# Patient Record
Sex: Female | Born: 1955 | Race: Black or African American | Hispanic: No | Marital: Single | Smoking: Former smoker
Health system: Southern US, Community
[De-identification: ages and names within clinical notes are randomized; demographics above are authoritative.]

## PROBLEM LIST (undated history)

## (undated) DIAGNOSIS — I10 Essential (primary) hypertension: Secondary | ICD-10-CM

## (undated) DIAGNOSIS — G510 Bell's palsy: Secondary | ICD-10-CM

## (undated) HISTORY — PX: ABDOMINAL SURGERY: SHX537

---

## 2009-05-29 ENCOUNTER — Ambulatory Visit: Payer: Self-pay | Admitting: Diagnostic Radiology

## 2009-05-29 ENCOUNTER — Emergency Department (HOSPITAL_BASED_OUTPATIENT_CLINIC_OR_DEPARTMENT_OTHER): Admission: EM | Admit: 2009-05-29 | Discharge: 2009-05-29 | Payer: Self-pay | Admitting: Emergency Medicine

## 2013-01-05 ENCOUNTER — Encounter (HOSPITAL_BASED_OUTPATIENT_CLINIC_OR_DEPARTMENT_OTHER): Payer: Self-pay | Admitting: *Deleted

## 2013-01-05 ENCOUNTER — Emergency Department (HOSPITAL_BASED_OUTPATIENT_CLINIC_OR_DEPARTMENT_OTHER)
Admission: EM | Admit: 2013-01-05 | Discharge: 2013-01-05 | Disposition: A | Discharge status: Home / Self Care | Payer: Self-pay | Attending: Emergency Medicine | Admitting: Emergency Medicine

## 2013-01-05 DIAGNOSIS — Z8669 Personal history of other diseases of the nervous system and sense organs: Secondary | ICD-10-CM | POA: Insufficient documentation

## 2013-01-05 DIAGNOSIS — Z79899 Other long term (current) drug therapy: Secondary | ICD-10-CM | POA: Insufficient documentation

## 2013-01-05 DIAGNOSIS — L0291 Cutaneous abscess, unspecified: Secondary | ICD-10-CM

## 2013-01-05 DIAGNOSIS — IMO0002 Reserved for concepts with insufficient information to code with codable children: Secondary | ICD-10-CM | POA: Insufficient documentation

## 2013-01-05 HISTORY — DX: Bell's palsy: G51.0

## 2013-01-05 MED ORDER — CEPHALEXIN 500 MG PO CAPS: 500.0000 mg | ORAL_CAPSULE | Freq: Four times a day (QID) | ORAL | Status: DC

## 2013-01-05 MED ORDER — HYDROCODONE-ACETAMINOPHEN 5-325 MG PO TABS: 2.0000 | ORAL_TABLET | ORAL | Status: DC | PRN

## 2013-01-05 MED ORDER — SULFAMETHOXAZOLE-TRIMETHOPRIM 800-160 MG PO TABS: 1.0000 | ORAL_TABLET | Freq: Two times a day (BID) | ORAL | Status: DC

## 2013-01-05 NOTE — ED Notes (Signed)
Pt. Has redness and edema noted under the R arm.  Pt. Has no drainage noted.

## 2013-01-05 NOTE — Discharge Instructions (Signed)
Abscess An abscess is an infected area that contains a collection of pus and debris.It can occur in almost any part of the body. An abscess is also known as a furuncle or boil. CAUSES  An abscess occurs when tissue gets infected. This can occur from blockage of oil or sweat glands, infection of hair follicles, or a minor injury to the skin. As the body tries to fight the infection, pus collects in the area and creates pressure under the skin. This pressure causes pain. People with weakened immune systems have difficulty fighting infections and get certain abscesses more often.  SYMPTOMS Usually an abscess develops on the skin and becomes a painful mass that is red, warm, and tender. If the abscess forms under the skin, you may feel a moveable soft area under the skin. Some abscesses break open (rupture) on their own, but most will continue to get worse without care. The infection can spread deeper into the body and eventually into the bloodstream, causing you to feel ill.  DIAGNOSIS  Your caregiver will take your medical history and perform a physical exam. A sample of fluid may also be taken from the abscess to determine what is causing your infection. TREATMENT  Your caregiver may prescribe antibiotic medicines to fight the infection. However, taking antibiotics alone usually does not cure an abscess. Your caregiver may need to make a small cut (incision) in the abscess to drain the pus. In some cases, gauze is packed into the abscess to reduce pain and to continue draining the area. HOME CARE INSTRUCTIONS   Only take over-the-counter or prescription medicines for pain, discomfort, or fever as directed by your caregiver.  If you were prescribed antibiotics, take them as directed. Finish them even if you start to feel better.  If gauze is used, follow your caregiver's directions for changing the gauze.  To avoid spreading the infection:  Keep your draining abscess covered with a  bandage.  Wash your hands well.  Do not share personal care items, towels, or whirlpools with others.  Avoid skin contact with others.  Keep your skin and clothes clean around the abscess.  Keep all follow-up appointments as directed by your caregiver. SEEK MEDICAL CARE IF:   You have increased pain, swelling, redness, fluid drainage, or bleeding.  You have muscle aches, chills, or a general ill feeling.  You have a fever. MAKE SURE YOU:   Understand these instructions.  Will watch your condition.  Will get help right away if you are not doing well or get worse. Document Released: 02/04/2005 Document Revised: 10/27/2011 Document Reviewed: 07/10/2011 [employer] Patient Information 2014 [employer].  

## 2013-01-05 NOTE — ED Provider Notes (Signed)
Medical screening examination/treatment/procedure(s) were performed by non-physician practitioner and as supervising physician I was immediately available for consultation/collaboration.   Shanna Cisco, MD 01/05/13 2030

## 2013-01-05 NOTE — ED Provider Notes (Signed)
CSN: [id number]     Arrival date & time 01/05/13  1243 History   First MD Initiated Contact with Patient 01/05/13 1349     Chief Complaint  Patient presents with  . Abscess   (Consider location/radiation/quality/duration/timing/severity/associated sxs/prior Treatment) HPI Comments: Pt states that she started with swelling and redness under her right arm 5 days ago:pt states that she has no history of similar symptoms:pt states that area seem to be increasing in size:denies drainage  The history is provided by the patient. No language interpreter was used.    Past Medical History  Diagnosis Date  . Bell's palsy     54yrs ago   No past surgical history on file. No family history on file. History  Substance Use Topics  . Smoking status: Not on file  . Smokeless tobacco: Not on file  . Alcohol Use: Not on file   OB History   Grav Para Term Preterm Abortions TAB SAB Ect Mult Living                 Review of Systems  Constitutional: Negative.   Respiratory: Negative.   Cardiovascular: Negative.     Allergies  Review of patient's allergies indicates no known allergies.  Home Medications   Current Outpatient Rx  Name  Route  Sig  Dispense  Refill  . cephALEXin (KEFLEX) 500 MG capsule   Oral   Take 1 capsule (500 mg total) by mouth 4 (four) times daily.   28 capsule   0   . HYDROcodone-acetaminophen (NORCO/VICODIN) 5-325 MG per tablet   Oral   Take 2 tablets by mouth every 4 (four) hours as needed for pain.   10 tablet   0   . sulfamethoxazole-trimethoprim (SEPTRA DS) 800-160 MG per tablet   Oral   Take 1 tablet by mouth 2 (two) times daily.   14 tablet   0    BP 160/100  Pulse 80  Temp(Src) 98.6 F (37 C) (Oral)  Resp 20  Ht 4\' 11"  (1.499 m)  Wt 101 lb (45.813 kg)  BMI 20.39 kg/m2  SpO2 100% Physical Exam  Nursing note and vitals reviewed. Constitutional: She is oriented to person, place, and time. She appears well-developed and well-nourished.   HENT:  Head: Normocephalic and atraumatic.  Eyes: Conjunctivae and EOM are normal.  Neck: Neck supple.  Cardiovascular: Normal rate and regular rhythm.   Pulmonary/Chest: Effort normal and breath sounds normal.  Musculoskeletal: Normal range of motion.  Neurological: She is alert and oriented to person, place, and time.  Skin:  Small red raised swollen fluctuant area to the under right arm:no drainage noted  Psychiatric: She has a normal mood and affect.    ED Course  INCISION AND DRAINAGE Date/Time: 01/05/2013 3:32 PM Performed by: Teressa Lower Authorized by: Teressa Lower Consent given by: patient Patient identity confirmed: verbally with patient Time out: Immediately prior to procedure a "time out" was called to verify the correct patient, procedure, equipment, support staff and site/side marked as required. Type: abscess Anesthesia: local infiltration Local anesthetic: lidocaine 2% with epinephrine Scalpel size: 11 Incision type: single straight Drainage: purulent Drainage amount: scant Patient tolerance: Patient tolerated the procedure well with no immediate complications.   (including critical care time) Labs Review Labs Reviewed - No data to display Imaging Review No results found.  MDM   1. Abscess    Tetanus is WUJ:WJXB have pt follow up as needed  Teressa Lower, NP 01/05/13 1532

## 2017-03-22 ENCOUNTER — Encounter (HOSPITAL_BASED_OUTPATIENT_CLINIC_OR_DEPARTMENT_OTHER): Payer: Self-pay

## 2017-03-22 ENCOUNTER — Other Ambulatory Visit: Payer: Self-pay

## 2017-03-22 ENCOUNTER — Emergency Department (HOSPITAL_BASED_OUTPATIENT_CLINIC_OR_DEPARTMENT_OTHER)
Admission: EM | Admit: 2017-03-22 | Discharge: 2017-03-22 | Disposition: A | Discharge status: Home / Self Care | Payer: Self-pay | Attending: Emergency Medicine | Admitting: Emergency Medicine

## 2017-03-22 ENCOUNTER — Emergency Department (HOSPITAL_BASED_OUTPATIENT_CLINIC_OR_DEPARTMENT_OTHER): Payer: Self-pay

## 2017-03-22 DIAGNOSIS — I10 Essential (primary) hypertension: Secondary | ICD-10-CM | POA: Insufficient documentation

## 2017-03-22 DIAGNOSIS — M79602 Pain in left arm: Secondary | ICD-10-CM | POA: Insufficient documentation

## 2017-03-22 DIAGNOSIS — Z87891 Personal history of nicotine dependence: Secondary | ICD-10-CM | POA: Insufficient documentation

## 2017-03-22 HISTORY — DX: Essential (primary) hypertension: I10

## 2017-03-22 LAB — COMPREHENSIVE METABOLIC PANEL
ALBUMIN: 4.3 g/dL (ref 3.5–5.0)
ALT: 12 U/L — AB (ref 14–54)
AST: 23 U/L (ref 15–41)
Alkaline Phosphatase: 56 U/L (ref 38–126)
Anion gap: 8 (ref 5–15)
BUN: 10 mg/dL (ref 6–20)
CHLORIDE: 106 mmol/L (ref 101–111)
CO2: 25 mmol/L (ref 22–32)
CREATININE: 0.73 mg/dL (ref 0.44–1.00)
Calcium: 9.9 mg/dL (ref 8.9–10.3)
GFR calc Af Amer: >60 mL/min (ref >60)
GFR calc non Af Amer: >60 mL/min (ref >60)
Glucose, Bld: 91 mg/dL (ref 65–99)
Potassium: 3.8 mmol/L (ref 3.5–5.1)
SODIUM: 139 mmol/L (ref 135–145)
Total Bilirubin: 0.7 mg/dL (ref 0.3–1.2)
Total Protein: 8 g/dL (ref 6.5–8.1)

## 2017-03-22 LAB — CBC
HCT: 35.9 % — ABNORMAL LOW (ref 36.0–46.0)
Hemoglobin: 11.8 g/dL — ABNORMAL LOW (ref 12.0–15.0)
MCH: 27.8 pg (ref 26.0–34.0)
MCHC: 32.9 g/dL (ref 30.0–36.0)
MCV: 84.7 fL (ref 78.0–100.0)
Platelets: 183 10*3/uL (ref 150–400)
RBC: 4.24 MIL/uL (ref 3.87–5.11)
RDW: 12.3 % (ref 11.5–15.5)
WBC: 2.9 10*3/uL — ABNORMAL LOW (ref 4.0–10.5)

## 2017-03-22 LAB — TROPONIN I: Troponin I: <0.03 ng/mL (ref <0.03)

## 2017-03-22 MED ORDER — ACETAMINOPHEN 325 MG PO TABS
650.0000 mg | ORAL_TABLET | Freq: Once | ORAL | Status: AC
  Administered 2017-03-22: 650 mg via ORAL
  Filled 2017-03-22: qty 2

## 2017-03-22 NOTE — Discharge Instructions (Addendum)
Please follow up with your PCP.   Please take tylenol every 6-8 hours as needed for your pain.    If your symptoms worsen please seek additional medical care.   Today your white blood cell count was low.  You need to follow-up with your doctor regarding this. While in the ED your blood pressure was high.  Please follow up with your primary care doctor or the wellness clinic for repeat evaluation as you may need medication.  High blood pressure can cause long term, potentially serious, damage if left untreated.

## 2017-03-22 NOTE — ED Provider Notes (Signed)
[facility] EMERGENCY DEPARTMENT Provider Note   CSN: [id number] Arrival date & time: 03/22/17  1214     History   Chief Complaint Chief Complaint  Patient presents with  . Arm Pain    HPI Brandy Peters is a 61 y.o. female who presents today for evaluation of left arm pain for approximately 1 week.  She says that this feels like her arthritis pain however it has never been in her arm before.  She says that her pain is worse with movement and is better with keeping it still.  She says that she has been keeping her left arm still for the past couple days.  She reports that she hit her forearm on the edge of a door approximately 1 week ago.  She does not currently have any pain here.  She has been taking 1 pill of Tylenol once a day for her pain with mild relief.  No fevers or chills.  She reports that she has a history of Bell's palsy and that she has continued droop on the left side of her face.  She says that this is unchanged and is at her normal baseline.  She also reports that her lower lip swelled its normal baseline.  She denies any new/abnormal facial swelling, facial droop, lip swelling.  Her family reports that they feel like her swelling, speech, and facial droop are at her baseline.  HPI  Past Medical History:  Diagnosis Date  . Bell's palsy    56yrs ago  . Hypertension     There are no active problems to display for this patient.   Past Surgical History:  Procedure Laterality Date  . ABDOMINAL SURGERY      OB History    No data available       Home Medications    Prior to Admission medications   Not on File    Family History No family history on file.  Social History Social History   Tobacco Use  . Smoking status: Former Games developer  . Smokeless tobacco: Never Used  Substance Use Topics  . Alcohol use: No    Frequency: Never  . Drug use: No     Allergies   Patient has no known allergies.   Review of Systems Review of Systems    Constitutional: Negative for chills, fatigue and fever.  Respiratory: Negative for chest tightness and shortness of breath.   Cardiovascular: Negative for chest pain, palpitations and leg swelling.  Musculoskeletal: Positive for arthralgias. Negative for neck pain and neck stiffness.       Left arm pain.   Skin: Negative for color change and wound.  Neurological: Positive for facial asymmetry and speech difficulty. Negative for numbness and headaches.     Physical Exam Updated Vital Signs BP (!) 159/94   Pulse 80   Temp 98.4 F (36.9 C) (Oral)   Resp 18   Ht 4\' 11"  (1.499 m)   Wt 44 kg (97 lb)   SpO2 100%   BMI 19.59 kg/m   Physical Exam  Constitutional: She is oriented to person, place, and time. She appears well-developed and well-nourished.  HENT:  Head: Normocephalic and atraumatic.  Mouth/Throat: No oropharyngeal exudate.  Eyes: Conjunctivae are normal.  Neck: Normal range of motion. Neck supple.  No neck tenderness.  Cardiovascular: Normal rate, regular rhythm, normal heart sounds and intact distal pulses.  No murmur heard. 2+ radial pulses bilaterally.   Pulmonary/Chest: Effort normal and breath sounds normal. No respiratory distress.  She has no wheezes. She exhibits no tenderness.  Musculoskeletal:  TTP over posterior left arm consistent with triceps.  The tenderness does not extend beyond the elbow.  There is no abnormal swelling, erythema, or ecchymosis.  No crepitus.  No TTP over left shoulder, or forearm.   Neurological: She is alert and oriented to person, place, and time. No sensory deficit. She exhibits normal muscle tone.  5/5 strength in bilateral upper extremities.  Obvious left sided facial droop.  Lower lip is enlarged.  Patient's speech is slightly slurred but understandable.  Skin: She is not diaphoretic.  Nursing note and vitals reviewed.    ED Treatments / Results  Labs (all labs ordered are listed, but only abnormal results are  displayed) Labs Reviewed  COMPREHENSIVE METABOLIC PANEL - Abnormal; Notable for the following components:      Result Value   ALT 12 (*)    All other components within normal limits  CBC - Abnormal; Notable for the following components:   WBC 2.9 (*)    Hemoglobin 11.8 (*)    HCT 35.9 (*)    All other components within normal limits  TROPONIN I    EKG  EKG Interpretation  Date/Time:  Monday March 22 2017 14:48:20 EST Ventricular Rate:  70 PR Interval:    QRS Duration: 86 QT Interval:  416 QTC Calculation: 449 R Axis:   -44 Text Interpretation:  Sinus rhythm No prior ECG for comparison.  No STEMI Left axis deviation Confirmed by Theda Belfastegeler, Chris ([id number]) on 03/22/2017 3:04:47 PM       Radiology Dg Chest 2 View  Result Date: 03/22/2017 CLINICAL DATA:  Chest and left upper extremity pain EXAM: CHEST  2 VIEW COMPARISON:  None. FINDINGS: There is no edema or consolidation. Heart size and pulmonary vascularity are normal. No adenopathy. There is aortic atherosclerosis. No pneumothorax. No bone lesions appreciable. IMPRESSION: Aortic atherosclerosis.  No edema or consolidation. Aortic Atherosclerosis (ICD10-I70.0). Electronically Signed   By: Bretta BangWilliam  Woodruff III M.D.   On: 03/22/2017 14:48   Dg Humerus Left  Result Date: 03/22/2017 CLINICAL DATA:  Pain over the last week.  Injury 6 days ago. EXAM: LEFT HUMERUS - 2+ VIEW COMPARISON:  None. FINDINGS: There is no evidence of fracture or other focal bone lesions. Soft tissues are unremarkable. IMPRESSION: Negative. Electronically Signed   By: Paulina FusiMark  Shogry M.D.   On: 03/22/2017 14:48    Procedures Procedures (including critical care time)  Medications Ordered in ED Medications  acetaminophen (TYLENOL) tablet 650 mg (650 mg Oral Given 03/22/17 1453)     Initial Impression / Assessment and Plan / ED Course  I have reviewed the triage vital signs and the nursing notes.  Pertinent labs & imaging results that were available  during my care of the patient were reviewed by me and considered in my medical decision making (see chart for details).    Ancil BoozerQueen Hamstra patient's today for evaluation of left arm pain that has been present for approximately 1 week.  She does report that she hit that arm, however is not painful over the area where she hit her arm.  X-rays were obtained which did not show any acute abnormalities.  CXR was obtained, no widened mediastinum, pneumonia, or other concerning acute lesions.  She was noted to be hypertensive in the ED.  She is not taking any medicines for her HTN as she does not have a PCP or insurance.  Basic labs were obtained, along with EKG and troponin  due to age, HTN, and vague left arm pain.  WBC low, but labs and EKG otherwise reassuring.  I instructed patient to obtain a PCP.  While in the ED her BP dropped to 159/94 once her family arrived and she was able to relax.    Instructed to take tylenol for her pain and in additional conservative measures.   Final Clinical Impressions(s) / ED Diagnoses   Final diagnoses:  Left arm pain  Hypertension, unspecified type    ED Discharge Orders    None       Norman ClayHammond, Decklin Weddington W, PA-C 03/22/17 2001    Tegeler, Canary Brimhristopher J, MD 03/22/17 2010

## 2017-03-22 NOTE — ED Triage Notes (Signed)
C/o pain to left arm x 1 week-states feels like arthritis pain-worse with movement-states she also injured left arm hitting it on the door at home 6 days ago-NAD-steady gait

## 2018-03-29 IMAGING — CR DG HUMERUS 2V *L*
3 series · 3 of 3 positions shown · non-contrast
Comparison: None.

CLINICAL DATA: Pain over the last week.  Injury 6 days ago.

EXAM:
LEFT HUMERUS - 2+ VIEW

[w humerus lat left *]
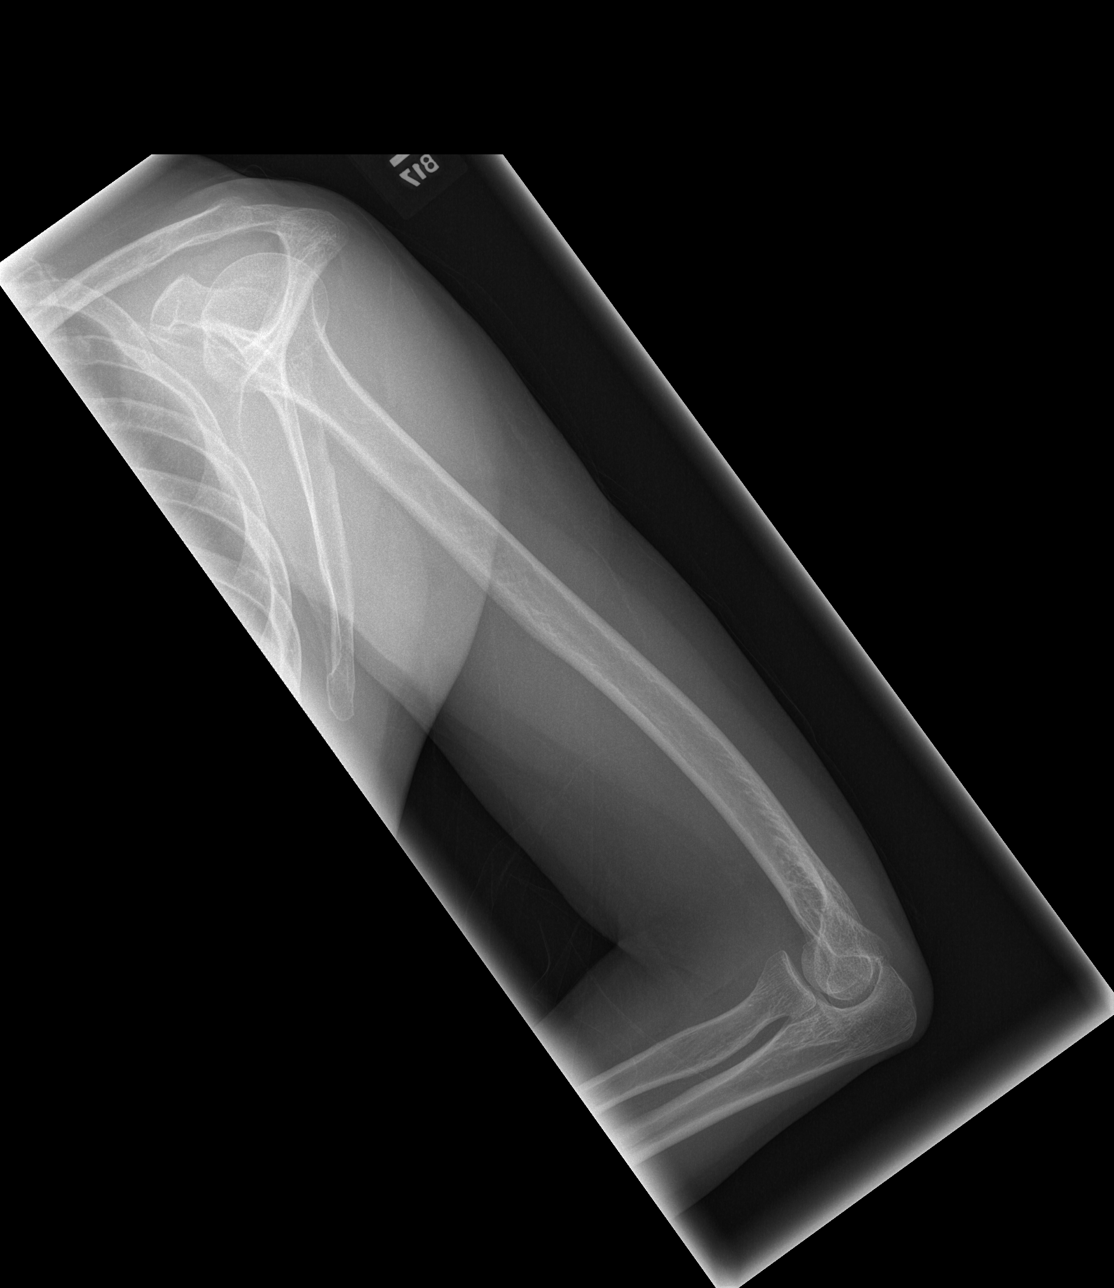

[w humerus ap left * (1 of 2)]
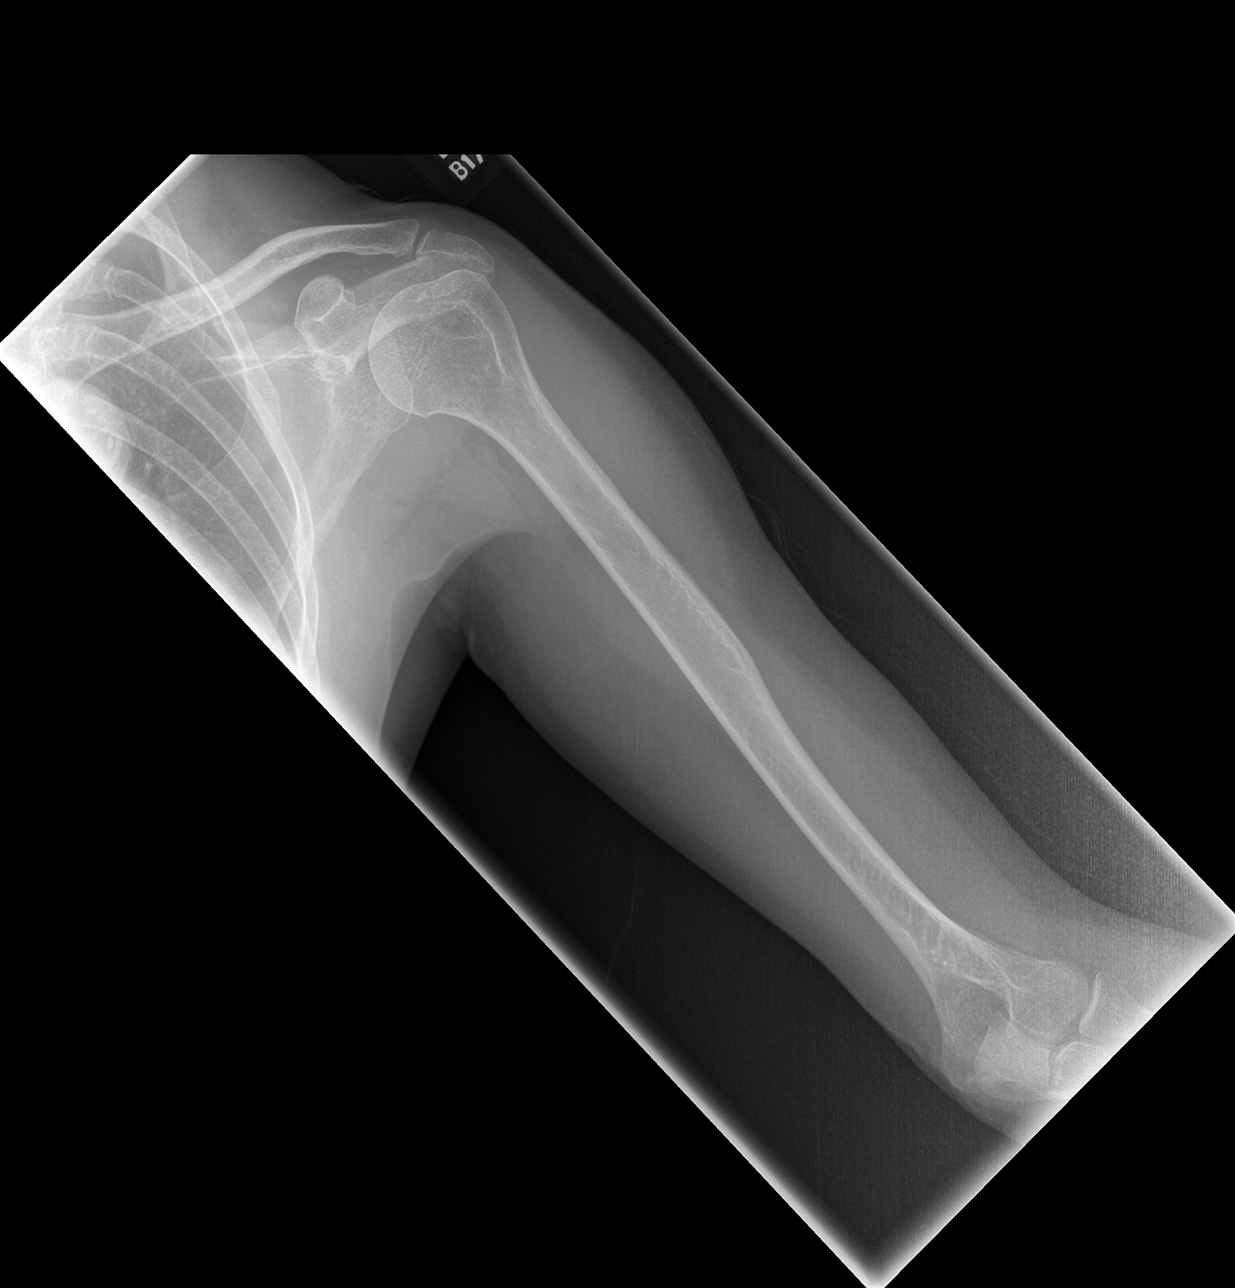

[w humerus ap left * (2 of 2)]
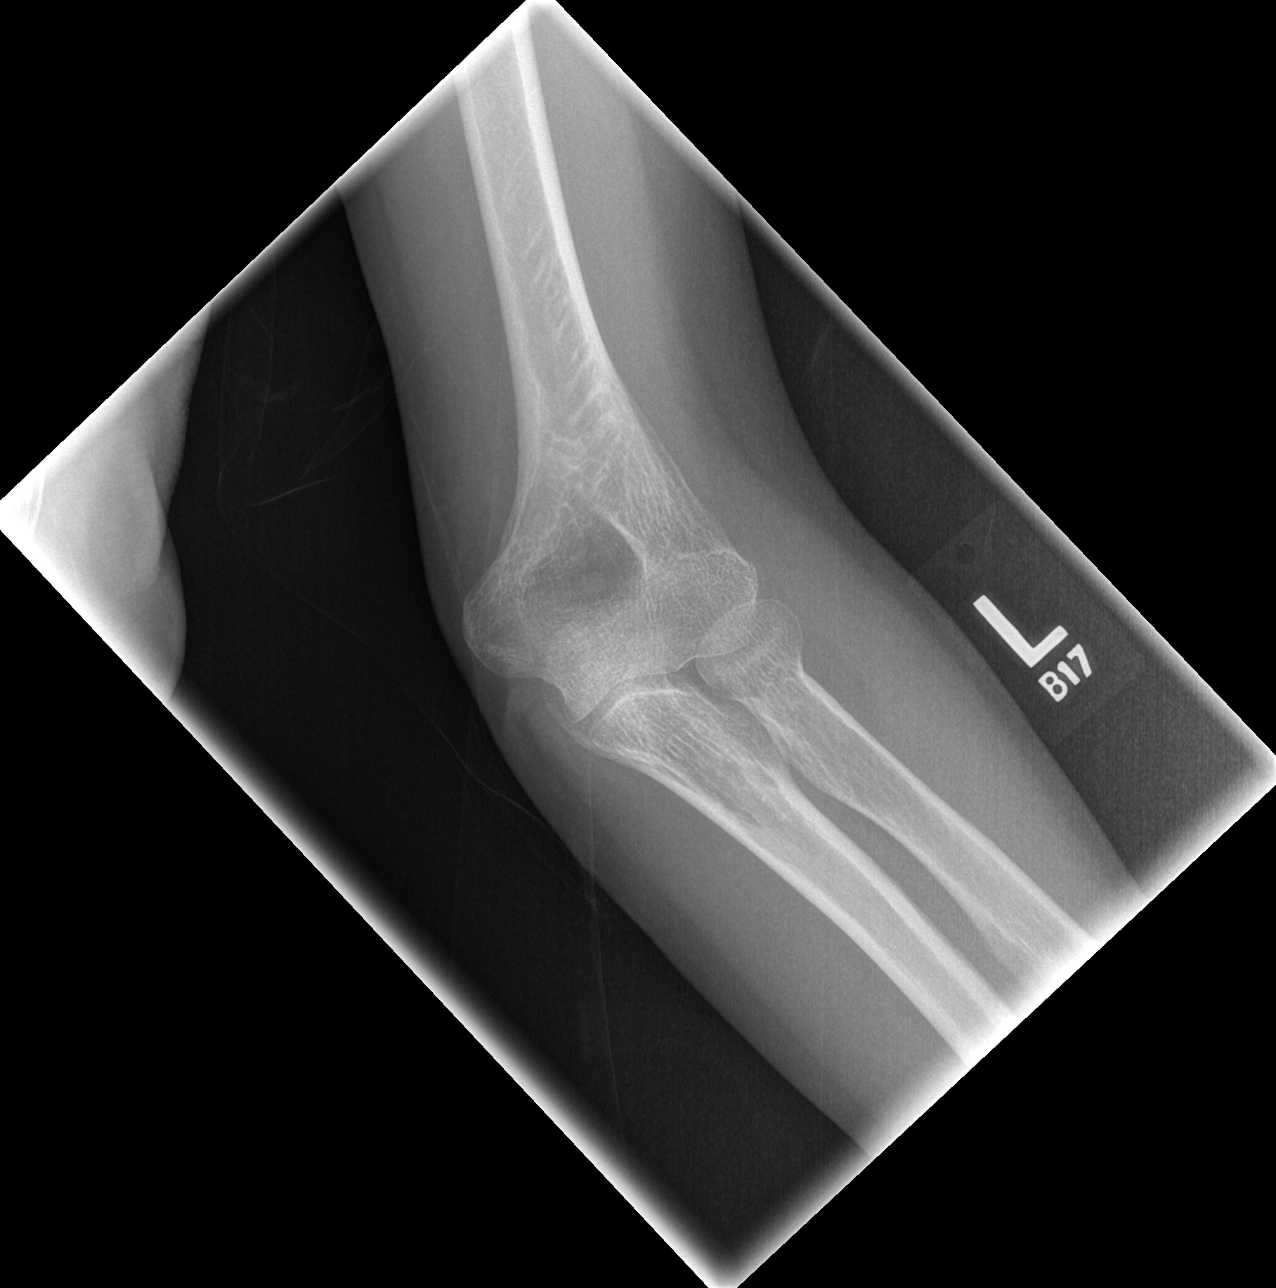

[3 of 3 positions shown; findings below may reference images not displayed]

FINDINGS: There is no evidence of fracture or other focal bone lesions. Soft
tissues are unremarkable.
IMPRESSION: Negative.
